# Patient Record
Sex: Male | Born: 1935 | Race: White | Hispanic: No | Marital: Married | Smoking: Never smoker
Health system: Southern US, Community
[De-identification: ages and names within clinical notes are randomized; demographics above are authoritative.]

---

## 2003-05-13 ENCOUNTER — Ambulatory Visit (HOSPITAL_COMMUNITY): Admission: RE | Admit: 2003-05-13 | Discharge: 2003-05-13 | Payer: Self-pay | Admitting: Gastroenterology

## 2003-06-16 ENCOUNTER — Ambulatory Visit (HOSPITAL_COMMUNITY): Admission: RE | Admit: 2003-06-16 | Discharge: 2003-06-16 | Payer: Self-pay | Admitting: Gastroenterology

## 2003-07-06 ENCOUNTER — Ambulatory Visit (HOSPITAL_COMMUNITY): Admission: RE | Admit: 2003-07-06 | Discharge: 2003-07-06 | Payer: Self-pay | Admitting: Gastroenterology

## 2003-07-17 ENCOUNTER — Ambulatory Visit (HOSPITAL_COMMUNITY): Admission: RE | Admit: 2003-07-17 | Discharge: 2003-07-17 | Payer: Self-pay | Admitting: Gastroenterology

## 2003-10-22 ENCOUNTER — Ambulatory Visit (HOSPITAL_COMMUNITY): Admission: RE | Admit: 2003-10-22 | Discharge: 2003-10-22 | Payer: Self-pay | Admitting: Gastroenterology

## 2004-01-04 ENCOUNTER — Ambulatory Visit (HOSPITAL_COMMUNITY): Admission: RE | Admit: 2004-01-04 | Discharge: 2004-01-04 | Payer: Self-pay | Admitting: Gastroenterology

## 2004-02-19 ENCOUNTER — Ambulatory Visit (HOSPITAL_COMMUNITY): Admission: RE | Admit: 2004-02-19 | Discharge: 2004-02-19 | Payer: Self-pay | Admitting: Oncology

## 2004-03-30 ENCOUNTER — Encounter (INDEPENDENT_AMBULATORY_CARE_PROVIDER_SITE_OTHER): Payer: Self-pay | Admitting: Specialist

## 2004-03-30 ENCOUNTER — Observation Stay (HOSPITAL_COMMUNITY): Admission: RE | Admit: 2004-03-30 | Discharge: 2004-03-31 | Payer: Self-pay | Admitting: Surgery

## 2005-02-02 ENCOUNTER — Ambulatory Visit (HOSPITAL_COMMUNITY): Admission: RE | Admit: 2005-02-02 | Discharge: 2005-02-02 | Payer: Self-pay | Admitting: Gastroenterology

## 2005-08-22 ENCOUNTER — Ambulatory Visit: Payer: Self-pay | Admitting: Gastroenterology

## 2005-08-24 ENCOUNTER — Ambulatory Visit: Payer: Self-pay | Admitting: Cardiology

## 2005-09-05 ENCOUNTER — Ambulatory Visit: Payer: Self-pay | Admitting: Gastroenterology

## 2007-05-01 ENCOUNTER — Inpatient Hospital Stay (HOSPITAL_COMMUNITY): Admission: EM | Admit: 2007-05-01 | Discharge: 2007-05-04 | Payer: Self-pay | Admitting: *Deleted

## 2007-05-01 ENCOUNTER — Ambulatory Visit: Payer: Self-pay | Admitting: Cardiology

## 2007-05-02 ENCOUNTER — Encounter (INDEPENDENT_AMBULATORY_CARE_PROVIDER_SITE_OTHER): Payer: Self-pay | Admitting: Internal Medicine

## 2007-05-02 ENCOUNTER — Ambulatory Visit: Payer: Self-pay | Admitting: Vascular Surgery

## 2007-05-23 ENCOUNTER — Ambulatory Visit: Payer: Self-pay

## 2007-05-23 ENCOUNTER — Encounter: Payer: Self-pay | Admitting: Cardiology

## 2007-07-22 ENCOUNTER — Ambulatory Visit: Payer: Self-pay | Admitting: Internal Medicine

## 2007-07-22 DIAGNOSIS — R1032 Left lower quadrant pain: Secondary | ICD-10-CM | POA: Insufficient documentation

## 2008-06-07 IMAGING — CT CT CHEST W/ CM
2 of 4 series · 15 of 36 positions shown, 18 images · IV contrast (omnipaque)
Comparison: Chest x-ray 05/01/07, CT thorax 02/02/05.

CLINICAL DATA: Syncope. Previos nodules on CT. 
CHEST CT WITH CONTRAST:
TECHNIQUE: Multidetector CT imaging of the chest was performed following the standard protocol during bolus administration of intravenous contrast.
Contrast:    180 cc Omnipaque 300.

[Series 2: chest routine 5.0 b40f · axial · 0.71mm/px · z∈[-306,-16]mm · 12 of 68 slices shown, 15 images]
[im 5/68  mediastinal]
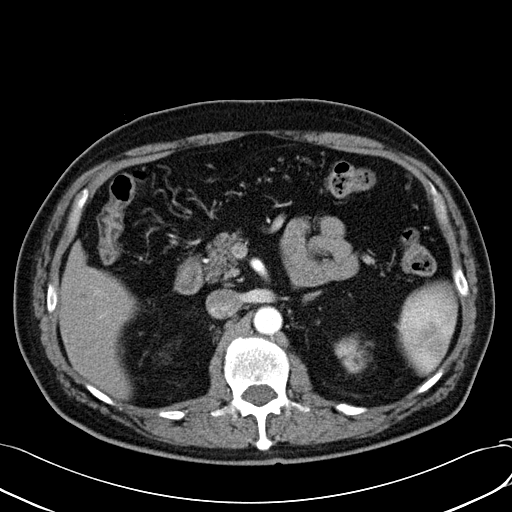
[im 5/68  lung]
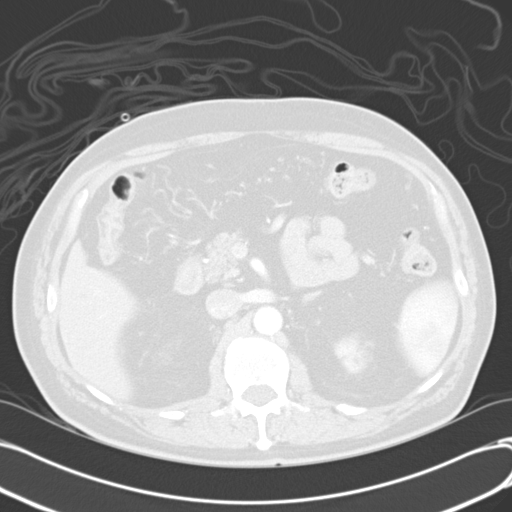
[im 10/68  lung]
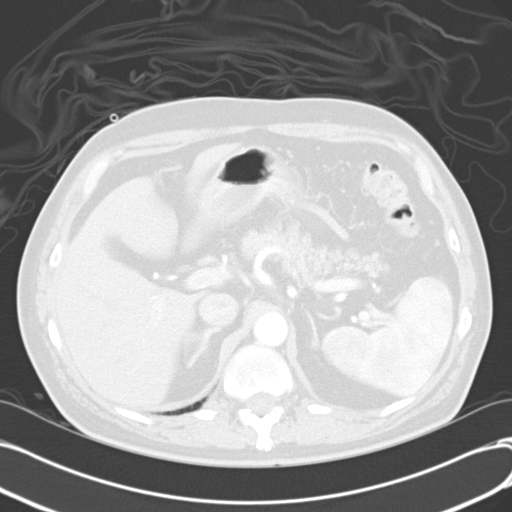
[im 15/68  lung]
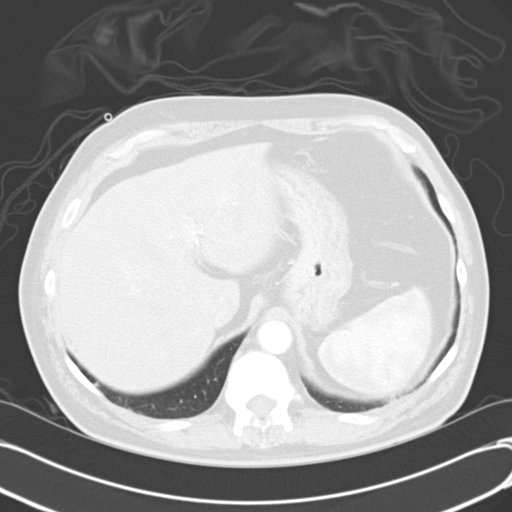
[im 20/68  lung]
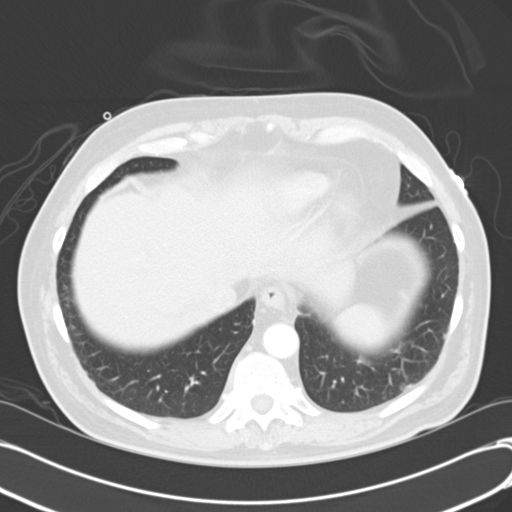
[im 24/68  mediastinal]
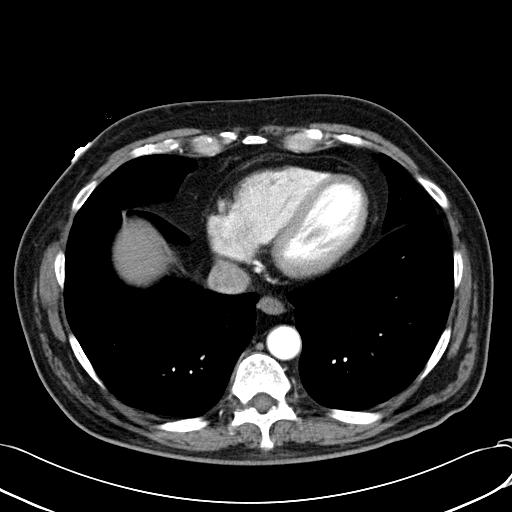
[im 24/68  lung]
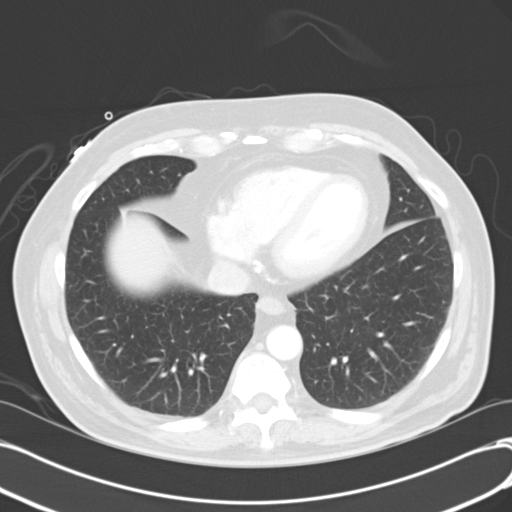
[im 29/68  lung]
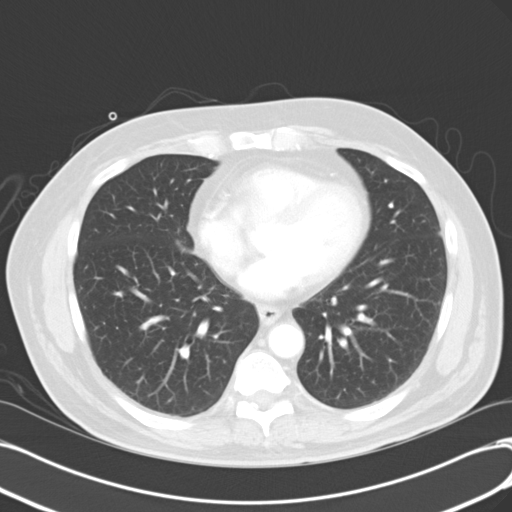
[im 39/68  lung]
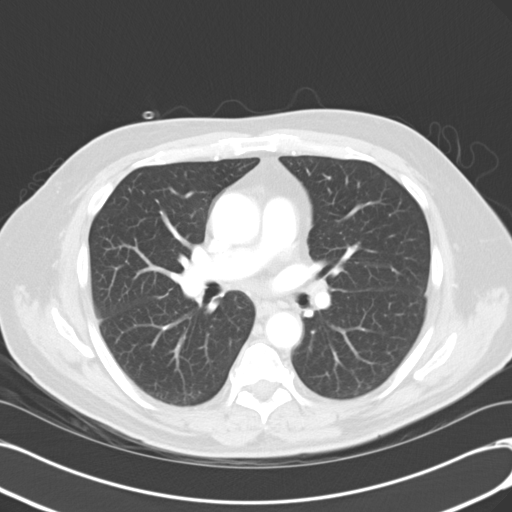
[im 44/68  lung]
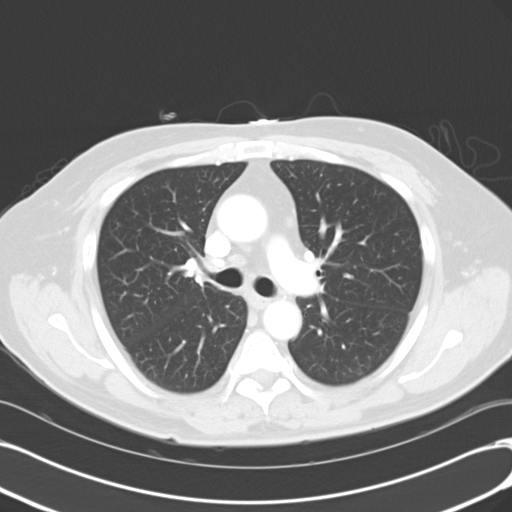
[im 48/68  mediastinal]
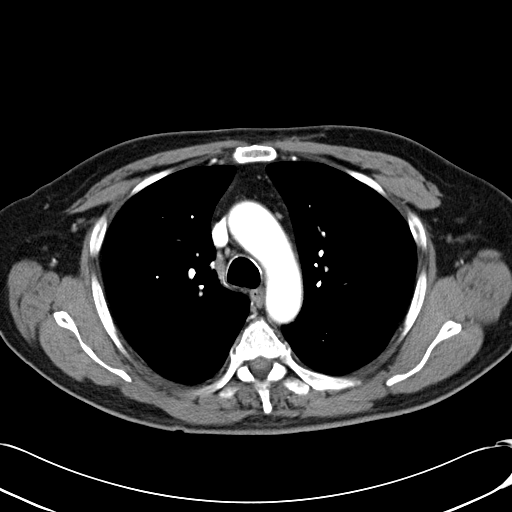
[im 48/68  lung]
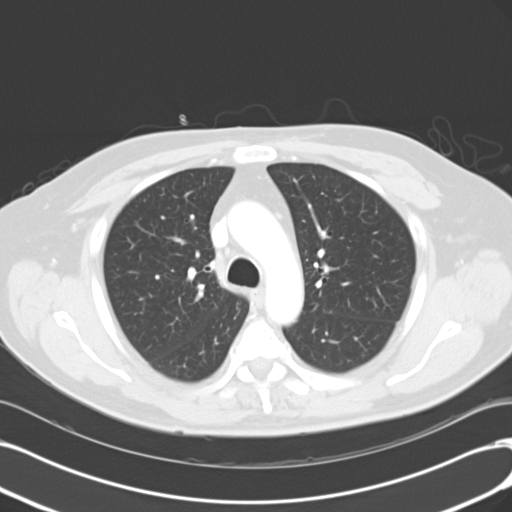
[im 53/68  lung]
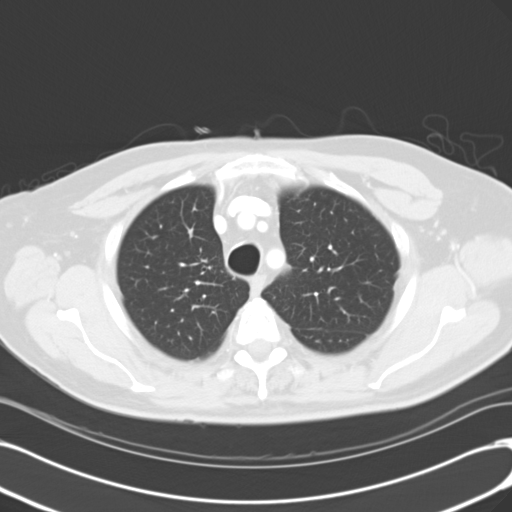
[im 58/68  lung]
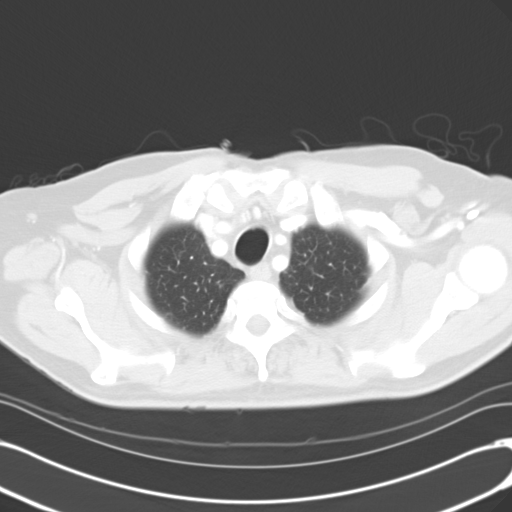
[im 63/68  lung]
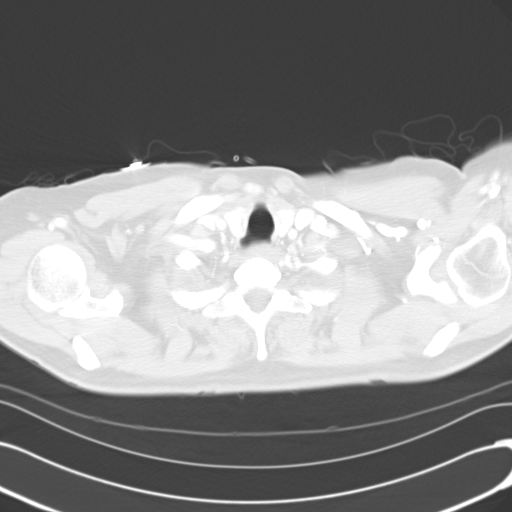

[Series 602: <mpr thick range> · coronal · 0.71mm/px · 3 of 80 slices shown]
[im 16/80  lung]
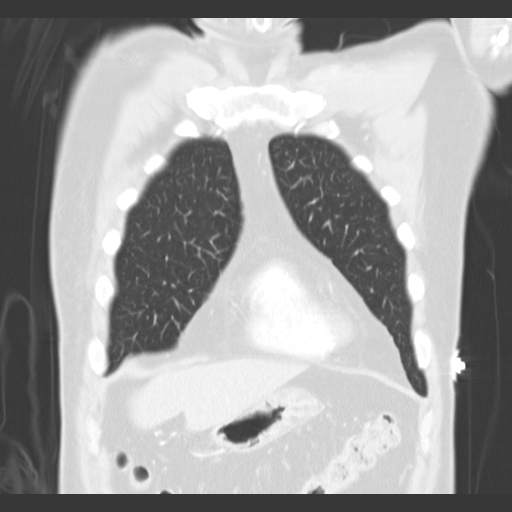
[im 32/80  lung]
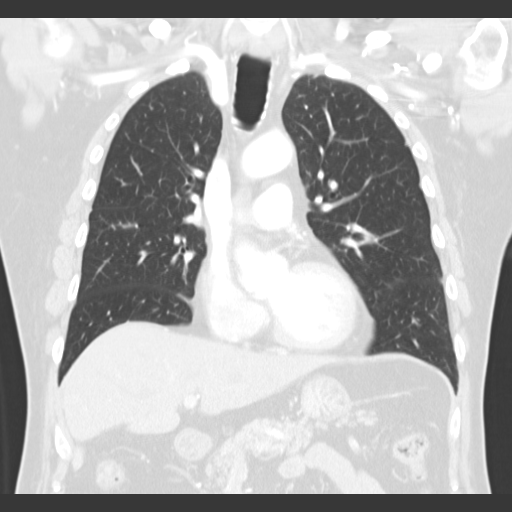
[im 48/80  lung]
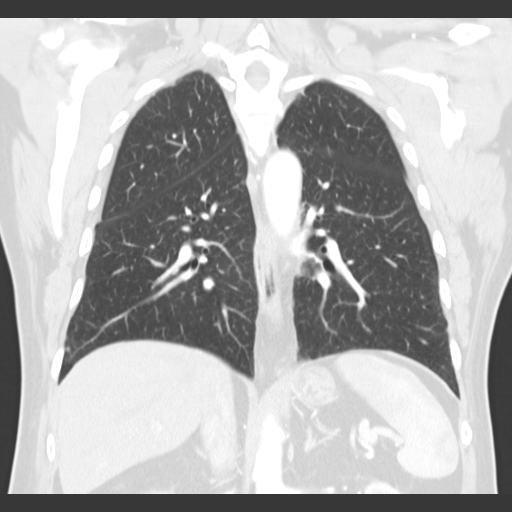

[15 of 36 positions shown; findings below may reference images not displayed]

FINDINGS: Review of the lung windows demonstrates no new or suspicious pulmonary nodules.  There is a 6 mm nodule in the right middle lobe (image 32) which is unchanged from 5552.  Vague nodular density in the right lower lobe on image 38 is smaller than prior. 
A subpleural nodule along the left oblique fissure is unchanged from prior (image 33).  
Review of the mediastinum demonstrates a mildly enlarged right hilar lymph node to 13 mm and a subcentimeter left hilar lymph node.  No pathologically enlarged mediastinal lymph nodes.  No axillary or supraclavicular lymph nodes.  
A limited view of the upper abdomen demonstrates prior cholecystectomy.  The adrenal glands are normal.  .
IMPRESSION: 1.  Stable, small pulmonary nodules.  
2.  Mild enlargement of the right hilar lymph node is nonspecific.

## 2010-08-30 NOTE — Consult Note (Signed)
[name], [name] NO.:  [id number]   MEDICAL RECORD NO.:  [id number]          PATIENT TYPE:  INP   LOCATION:  1411                         FACILITY:  [facility]   PHYSICIAN:  Melvyn Novas, M.D.  DATE OF BIRTH:  1935-05-28   DATE OF CONSULTATION:  05/02/2007  DATE OF DISCHARGE:                                 CONSULTATION   This is a neurology consultation called by Dr. Eda Paschal.  The patient is  an outpatient of Dr. Maisie Fus Day at [facility].   REASON FOR CONSULTATION:  Cardiogenic syncope.   CHIEF COMPLAINT:  The patient was admitted yesterday after a multiple-  minute spell of staring off and slumping to the right while sitting in a  chair.  Meanwhile, his daughter was demonstrating something to him on  the computer, noticed his absent-mindedness, tried to awaken him and  could not arouse him or get a response for several minutes.  The patient  had no observed tonic-clonic activity, no tongue bite, no incontinence,  and woke up without confusion, but, according to the daughter, had  stopped breathing or held his breath for awhile and started to breathe  very deeply when he came to.  The gentleman is a 75 year old male with a  history of benign prostate hyperplasia, thyroid hypofunction,  dehydration.  Yesterday he was hypotensive when EMS arrived.  He has  also a history of nephrolithiasis and diverticulitis.   REVIEW OF SYSTEMS:  I fainted for 4 or 5 minutes.  My daughter could  not get me to respond, and she yelled at me and shook me according to  the other family members.  I seemed to have stared into the air.  I  still do not remember what happened.   This 75 year old male was admitted yesterday at lunchtime with a  syncopal episode.  Has normal cardiac enzymes, a slightly elevated TSH.  Blood pressure when EMT arrived at the house was 80/50, but the patient  was able to walk to the bathroom, had a bowel movement, and when he  returned  blood pressure was measured again and was 106/80.  The patient  still received IV fluids from EMTs.  Normal chem-7 and CBC with diff  were ordered.   SOCIAL HISTORY:  The patient is married, has two adult daughters, is a  nonsmoker, nondrinker.   FAMILY HISTORY:  Positive for his father having had a myocardial  infarction and his mother a cerebral aneurysm; he is not sure at which  age.   The patient is on Flomax and hydrocodone p.r.n. for back pain.   PHYSICAL EXAMINATION:  VITAL SIGNS:  98 degrees Fahrenheit, pulse 70  beats per minute, respiratory rate is 20, blood pressure here is 130/72  today.  GENERAL:  The patient has been withheld Flomax and states that at home  he takes it very irregularly if needed.  He took it the morning before  he had the fainting spell, however.  HEENT:  Pupils react equal to light, have full extraocular movements.  He seems to be alert and oriented but is fairly  hard to understand and  seems to have a mild dysarthria, which the patient's family states is  his baseline.  He has full peripheral vision, normal facial movements.  Tongue and uvula midline.  NECK:  Supple.  NEUROLOGIC:  The patient has all four extremities with equal strength,  tone and mass.  Deep tendon reflexes 1+.  Downgoing toes to plantar  stimulation.  Sensory:  He felt all primary modalities equally  Coordination:  Normal finger-to-nose test without tremor, dysmetria or  ataxia.  Gait:  The patient is able to walk to the bathroom unassisted.   The patient just finished an MRI and an echocardiogram in the bedroom.  According to the echo, he might have mild left ventricular hypertrophy.  The MRI was not yet read or available.   My plan at this time is to use a diagnosis of syncope, 780.2.  I do not think a further workup is needed.  An EEG should be performed while he is still in the hospital.  He could be discharged afterwards with a cardiac monitoring device.  A neurologic  followup is not needed if the EEG turns out to be normal  and the MRI shows no abnormalities.      Melvyn Novas, M.D.  Electronically Signed     CD/MEDQ  D:  05/02/2007  T:  05/02/2007  Job:  [id number]   cc:   Hind Bosie Helper, MD

## 2010-08-30 NOTE — Procedures (Signed)
EEG NUMBER:  [id number].   HISTORY:  This is a 75 year old with syncopal event and confusion who is  having the EEG done to evaluate for seizure activity.   PROCEDURE:  This is a portable EEG.  Technical description:  Throughout  this portable EEG there is a posterior dominant rhythm of 9 to 10 Hz,  activity at 10 to 20 microvolts.  Background activity is symmetric,  mostly alpha activity at 10 to 30 microvolts.  Photic stimulation or  hyperventilation performed throughout this record.  The patient does  become drowsy, however, does not enter stage two sleep.  Throughout this  record, there is no definitive epileptiform activity noted throughout  the background.  EKG tracing reveals a heart rate of 60 to 70 beats a  minute.   IMPRESSION:  This routine EEG is within normal limits in the awake  state.  If seizures are of high concern we will consider a sleep-  deprived EEG for further evaluation.      Bevelyn Buckles. Nash Shearer, M.D.  Electronically Signed     ZOX:WRUE  D:  05/03/2007 13:43:28  T:  05/03/2007 14:45:35  Job #:  [id number]

## 2010-08-30 NOTE — Consult Note (Signed)
NAMEJAZPER, [name] NO.:  [id number]   MEDICAL RECORD NO.:  [id number]          PATIENT TYPE:  INP   LOCATION:  1411                         FACILITY:  [facility]   PHYSICIAN:  Madolyn Frieze. Jens Som, MD, FACCDATE OF BIRTH:  1936-01-22   DATE OF CONSULTATION:  05/03/2007  DATE OF DISCHARGE:                                 CONSULTATION   [name] is a 75 year old male with no prior cardiac history who we  were asked to evaluate for syncope.  Note, he typically does not have  dyspnea on exertion, orthopnea, PND, pedal edema, palpitations,  exertional chest pain.  The patient has been on Flomax for approximately  2 years.  He states that he takes this every-other day.  On days that he  takes it he becomes lightheaded after standing immediately after taking  the medication.  However, he has never had syncope.  On May 02, 2007, he was admitted by the [facility] following an episode of  syncope.  He states that his daughter was showing him something on the  computer.  He stood to walk to see her and became lightheaded.  He then  went back to his chair to sit down but his symptoms continued to worsen  and then he had frank syncope.  There was no associated chest pain,  nausea/vomiting or shortness of breath.  There was no incontinence or  seizure activity.  There was no weakness or loss of sensation of his  extremities.  Apparently, his blood pressure was low at the time of the  admission in the systolic range of 80 which slowly improved.  Since  admission, he has had cardiac markers that have been normal.  His  initial hemoglobin and hematocrit were 14 and 39.6.  A D-dimer was also  normal.  Note, a TSH has been elevated and being cared for by the  primary care service.  His initial electrocardiogram showed a sinus  rhythm at a rate of 64.  There were no ST changes and the intervals were  normal.  He also had an echocardiogram during this admission that showed  normal LV function and trivial aortic insufficiency.  The right  ventricle was mildly dilated.  He has had no problems since admission.  Note, he has been seen by neurology.  He has had an MRI that showed no  infarct.  They also performed an EEG with the results pending at the  time of this dictation.  Because of his syncopal episode, we were asked  to further evaluate.   PAST MEDICAL HISTORY:  There is no diabetes mellitus, hypertension or  hyperlipidemia.  He has had a history of diverticulitis as well as  benign prostatic hypertrophy.  He has had a prior cholecystectomy as  well as hernia repair.  He has had nephrolithiasis.   SOCIAL HISTORY:  He does not smoke nor does he consume alcohol.   FAMILY HISTORY:  Positive for coronary artery disease in his father who  had a myocardial infarction at age 5.   He has an allergy to COMPAZINE.  PRESENT MEDICATIONS:  Include enoxaparin, Protonix, and p.r.n.  medications.   REVIEW OF SYSTEMS:  He denies any headaches or fevers or chills.  There  is no productive cough or hemoptysis.  There is no dysphagia,  odynophagia, melena or hematochezia.  There is no dysuria or hematuria.  There is no rash or seizure activity.  There is no orthopnea, PND or  pedal edema.  The remaining systems are negative.   PHYSICAL EXAMINATION TODAY:  He is afebrile with temperature of 97.8.  His blood pressure is 136/80.  His pulse is 66.  He is well-developed  and well-nourished no acute stress.  His skin is warm and dry.  He does  not appear to be depressed and there is no peripheral clubbing.  His  back is normal.  HEENT:  Normal with normal eyelids.  NECK:  Supple with a normal upstroke bilaterally and there are no bruits  noted.  There is no jugular distention and no thyromegaly noted.  CHEST:  Clear to auscultation with normal expansion.  CARDIOVASCULAR:  Reveals a regular rate and rhythm with normal S1 and  S2.  There are no murmurs, rubs or gallops  noted.  ABDOMINAL EXAM:  Not tender or distended.  Positive bowel sounds.  No  hepatosplenomegaly.  No mass appreciated.  There is no abdominal bruit.  He has 2+ femoral pulses bilaterally and no bruits.  EXTREMITIES:  Show no edema and I can palpate no cords.  He has 2+  dorsalis pedis pulses bilaterally.  NEUROLOGIC:  Grossly intact.   His electrocardiogram is described above.  His renal function today show  BUN and creatinine of 15 and 0.95.  His hemoglobin and hematocrit are  12.3 and 33.9 today with a platelet count of 160.  His cardiac markers  are normal.   DIAGNOSIS:  1. Syncope - note the patient has been on Flomax for 2 years and he      has episodes of lightheadedness with standing every time he takes      the medication.  His syncopal episode occurred immediately after      taking Flomax and also sounded to be orthostatic in nature.  His      blood pressure was also documented to be low at that time.  Note,      his cardiac markers are normal, his ECG is normal, and his      echocardiogram shows normal LV function.  I do not think we need to      pursue further inpatient cardiac workup.  If possible, I would      favor discontinuing his Flomax in favor of another medication based      on urology suggestions.  If this cannot be done then I would      recommend adequate hydration and for the patient to stand slowly on      the days that he takes his Flomax.  We will plan to proceed with an      outpatient stress echocardiogram, although I think ischemia is      highly unlikely.  This is particular light of his family history of      coronary disease.  He will follow up with me after his stress      echocardiogram.  We could consider a monitor in the future if he      has recurrent episodes, but I strongly feel that this is most      likely related to orthostasis related  to Flomax.  2. Benign prostatic hypertrophy - management should be discussed with      urology.  We would  recommend discontinuing the Flomax if possible.      If it must be continued, then we would recommend adequate salt and      hydration intake and slowly standing after taking that medication.  3. History of nephrolithiasis.  4. History of diverticulitis.   The patient can be discharged from a cardiac standpoint and followup  will be arranged.      Madolyn Frieze Jens Som, MD, [facility]  Electronically Signed     BSC/MEDQ  D:  05/03/2007  T:  05/04/2007  Job:  [id number]

## 2010-08-30 NOTE — H&P (Signed)
[name], [name] NO.:  [id number]   MEDICAL RECORD NO.:  [id number]          PATIENT TYPE:  EMS   LOCATION:  ED                           FACILITY[facility]  [facility]   PHYSICIAN:  Herbie Saxon, MDDATE OF BIRTH:  Sep 30, 1935   DATE OF ADMISSION:  05/01/2007  DATE OF DISCHARGE:                              HISTORY & PHYSICAL   PRIMARY CARE PHYSICIAN:  Karene Fry Day, M.D. [facility].   No health care power of attorney designated.  He is a full code.   UROLOGIST:  Dr. Trey Paula   CHIEF COMPLAINT:  Passing out for five minutes today.   HISTORY OF PRESENT ILLNESS:  This is a 75 year old male who presents  after having a brief episode of passing out.  According to the daughter,  she and the patient were looking up something on the computer when he  suddenly started making gurgling sounds and then became unresponsive  staring at the ceiling, not responsive to speech or noxious stimuli.  His head was turned to the right.  There were no tonic clonic movements.  The patient also was noted to be having episodes of agonal breathing  followed by periods of apnea.  The whole episode lasted less than five  minutes.  When EMS was called, they noticed his blood pressure was low  at 80/50, however, he became alert and his blood pressure has been  improving since he was started on IV fluid.  The patient did not have  any urine incontinence, no biting of his tongue, no headache, no  wheezing was noted.  However, the daughter noticed that he was  diaphoretic during this whole episode.  The patient has never had this  sort of episode before.  He denies any preceding chest pain or shortness  of breath in the last few days.  No cough, no wheeze, no body swelling.  He does not have any diarrhea.  No dysuria.  No hematuria.  No nocturia.  The patient has not had any palpitations and never had any syncopal  episode before.  At present he is asymptomatic and  saturating 99% on  room air.  The patient did not have any associated fever or chills.   PAST MEDICAL HISTORY:  Kidney stones, diverticulitis, prostatic  hypertrophy.   PAST SURGICAL HISTORY:  Two herniorrhaphies, cholecystectomy.   FAMILY HISTORY:  Father died of a myocardial infarction.  Mother died of  a brain aneurysm and she also had pernicious anemia.   SOCIAL HISTORY:  No history of drug abuse, cigarette smoking, or alcohol  abuse.  He is married with children.  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   REVIEW OF SYSTEMS:  14 systems were reviewed and negative.   ALLERGIES:  COMPAZINE.   MEDICATIONS:  1. Flomax.  2. Hydrocodone one q.6 hours p.r.n.   PHYSICAL EXAMINATION:  GENERAL:  He is an elderly man in no acute  respiratory distress.  VITAL SIGNS:  Temperature 98, pulse 70, respiratory rate 20, blood  pressure 135/72.  HEENT:  Pupils equal, round, and reactive to light and accommodation.  Head is normocephalic and atraumatic.  Mucous membranes are moist.  NECK:  Supple.  There is no carotid bruit, no thyromegaly, and no  elevated jugular venous distention.  CHEST:  Clinically clear.  HEART:  Sounds 1 and 2 regular rate and rhythm.  ABDOMEN:  Soft and nontender.  No organomegaly.  Bowel sounds are  normoactive.  Inguinal orifices are patent.  NEUROLOGY:  He is alert and oriented to time, place, and person.  Speech  and memory are normal.  Power is 5 in all limbs.  Deep tendon reflexes  are 2+ globally.  Peripheral pulses present with no pedal edema.   LABORATORY DATA:  WBC 7.4, hematocrit 39.6, platelet count 173.  Urinalysis is negative.  Chemistry shows a sodium of 141, potassium 3.8,  chloride 108, bicarbonate 25, glucose 123, BUN 15, creatinine 0.9, AST  17, ALT 15, troponin I less than 0.05.  EKG shows normal sinus rhythm of  64.  Initial rhythm strip shows sinus  bradycardia at 59 per minute,  otherwise normal EKG.   CT of brain was normal.  The chest x-ray shows COPD changes.   ASSESSMENT:  1. Syncopal episode.  2. Altered mental status.  3. Transient seizure disorder.  4. Transient hypertension, rule out secondary to Flomax.  5. Chronic obstructive pulmonary disease, stable.   PLAN:  The patient is to be admitted to a telemetry bed for syncopal  workup.  We will obtain an EEG, two-dimensional echocardiogram, carotid  Doppler.  Obtain a CT of chest with contrast.  We will monitor his  serial cardiac enzymes and EKG's to rule out any underlying coronary  artery disease.  We will get his thyroid function tests and lipids and  will place him on Lovenox 40 mg subcu daily for DVT prophylaxis and  Protonix 40 mg IV daily for stress ulcer prophylaxis.  Obtain a  neurology and cardiology evaluation in the morning.  We will put him on  DuoNeb q.6 hours p.r.n.  The patient is for possible sleep apnea workup  with sleep study as an outpatient.  He will stay on IV fluid normal  saline at 40 mL an hour.  For now we will put him on a regular diet as  tolerated, oxygen 2-4 liters by nasal cannula p.r.n. to keep his  saturations greater than 90%.  We will get his orthostatic blood  pressure checked q.shift and is to be on seizure, fall, and aspiration  precautions.  His illness, medication, tests, and treatment plan  discussed with him and his family.  They verbalized understanding.      Herbie Saxon, MD  Electronically Signed     MIO/MEDQ  D:  05/01/2007  T:  05/01/2007  Job:  [id number]

## 2010-08-30 NOTE — Discharge Summary (Signed)
[name], [name]                ACCOUNT NO.:  [id number]   MEDICAL RECORD NO.:  [id number]          PATIENT TYPE:  INP   LOCATION:  1411                         FACILITY:  [facility]   PHYSICIAN:  Hind I Elsaid, MD      DATE OF BIRTH:  [birth year]   DATE OF ADMISSION:  05/01/2007  DATE OF DISCHARGE:  05/04/2007                               DISCHARGE SUMMARY   DISCHARGE DIAGNOSES:  1. Syncope felt to be secondary to Flomax induced orthostatic      hypotension.  2. Hypotension, resolved.  3. Subclinical hypothyroidism.  4. Benign prostatic hypertrophy.  5. History of diverticulosis.  6. Kidney stone.   MEDICATIONS:  1. Hydrocodone one tab p.o. q.6 h p.r.n.  2. Flomax was discontinued.   HOSPITAL CONSULTATIONS:  1. Neurology consulted for evaluation of possible seizures.  2. Cardiology consulted.   HISTORY OF PRESENT ILLNESS:  This is a 75 year old male who presented  after having this as result of passing out.  Mainly the syncope happened  after the patient taking Flomax.   PROCEDURES:  1. MRI of the brain.  No acute infarct.  Moderate nonspecific white      matter change may be related to small vessel disease.  2. 2-D echo showed ejection fraction of 55 with no left ventricular      motion abnormality.  .  3. Carotid duplex was negative for any carotid stenosis.  4. EEG result is pending.   HOSPITAL COURSE:  The patient was admitted to the hospital for  evaluation of the cause of syncope, admitted to tele with cardiac  monitor which was uneventful for any stated arrhythmia.  MRI of the  brain was negative for any acute infarct or source as the cause or  possibility of seizure.  Also EEG done but there result is pending.  The  result is to be followed with primary care.  Also during hospitalization  the patient had cardiac enzymes recycled which were absolutely negative  and 2-D echo which does not show any evidence of ventricular wall motion  abnormalities.  We felt the  syncope was secondary to Flomax.  Neuro and cardiology did  follow the patient and cardiology recommended outpatient stress echo to  be done as outpatient.  Accordingly, Flomax was discontinued.  The  patient will receive an appointment to follow with cardiology at [facility]  as outpatient.      Hind Bosie Helper, MD  Electronically Signed     HIE/MEDQ  D:  05/04/2007  T:  05/04/2007  Job:  [id number]

## 2010-08-30 NOTE — Assessment & Plan Note (Signed)
[facility]                         GASTROENTEROLOGY OFFICE NOTE   [name], [name]                       MRN:          [id number]  DATE:07/22/2007                            DOB:          [birth year]    CHIEF COMPLAINT:  Left-sided abdominal pain.   HISTORY:  The patient is a man previously seen by Dr. Madilyn Fireman and Dr. Victorino Dike.  He has had intermittent left-sided abdominal pain since at  least 2005.  There is an extensive workup with CT scans.  He has thought  that he might have diverticulosis or diverticulitis, but neither CT nor  colonoscopy have shown that.  His last CT in May of 2007 was normal.  He  continued to have these intermittent spells.  He feels gassy.  He will  belch and feel better and he will have flatus.  He has bilateral  inguinal herniorrhaphies.  He has had a cholecystectomy.  He has a  diagnosis of reflux disease.  He has had nephrolithiasis.  He has not  really lost weight or had any other problems.  He had a borderline sed  rate elevated, but normal CBC and CMET in 2007.  Recently, he had some  problems again.  He had a distended abdomen and felt very bloated and  then did not get relief until he vomited.  That has not really ever  happened before.  He generally moves his bowels regularly.  There is no  fever or bleeding.  His wife it starts after strenuous activity at  times.  He did receive antibiotics recently for presumed diverticulitis.   SOCIAL HISTORY:  He is not a smoker.  He is here with his wife.   MEDICATIONS:  Prilosec OTC as needed.   ALLERGIES:  COMPAZINE, FLOMAX.   PHYSICAL EXAMINATION:  GENERAL:  Well-developed, elderly white man.  There is a speech impediment.  VITAL SIGNS:  Weight 160 pounds, blood pressure 110/68, pulse 92.  HEENT:  Eyes anicteric.  ABDOMEN:  Soft and nontender.  No splash.  There are herniorrhaphy scars  and laparoscopic cholecystectomy scars.  I detect no hernia.  The groin  is  free of lymphadenopathy.  NEUROLOGY:  Cranial nerves II-XII grossly intact.   ASSESSMENT:  Recurrent left-sided abdominal pain mainly in the left  lower quadrant.  He has gas and bloating.  He has had an episode of  nausea and vomiting and abdominal distention.   I wonder if he has not had some sort of recurrent partial obstruction  from adhesions.  Whatever this is it certainly seems to be benign.  He  was reassured today.   PLAN:  He will try to take Align.  Perhaps this probiotic will help with  the gassiness and bloating feeling.  I talked about working this up  further with something like a CT enterography, but I do not think  anybody would operate for a problem even if they found something given  the overall symptoms since repeat operations may lead to adhesions.   He will follow up as needed.   NOTE:  This man does not  have diverticulosis or diverticulitis as best I  can tell.     Iva Boop, MD,FACG  Electronically Signed    CEG/MedQ  DD: 07/22/2007  DT: 07/22/2007  Job #: [id number]   cc:   Alfredia Client, MD

## 2010-09-02 NOTE — Op Note (Signed)
NAME:  [name], [name]                     ACCOUNT NO.:  [id number]   MEDICAL RECORD NO.:  [id number]                   PATIENT TYPE:  AMB   LOCATION:  ENDO                                 FACILITY:  [facility]   PHYSICIAN:  John C. Madilyn Fireman, M.D.                 DATE OF BIRTH:  Jul 16, 1935   DATE OF PROCEDURE:  05/13/2003  DATE OF DISCHARGE:                                 OPERATIVE REPORT   PROCEDURE:  Colonoscopy.   INDICATIONS FOR PROCEDURE:  Left lower quadrant abdominal pain in a patient  with no prior colon screening.   DESCRIPTION OF PROCEDURE:  The patient was placed in the left lateral  decubitus position and placed on the pulse monitor with continuous low-flow  oxygen delivered by nasal cannula.  He was sedated with 75 mcg IV fentanyl  and 8 mg IV Versed.  The Olympus video colonoscope was inserted into the  rectum and advanced to the cecum, confirmed by transillumination of  McBurney's point and visualization of the ileocecal valve and appendiceal  orifice.  Prep was excellent.  The cecum, ascending, transverse, descending,  and sigmoid colon all appeared normal with no masses, polyps, diverticula,  or other mucosal abnormalities.  The rectum likewise appeared normal and  retroflexed view of the anus revealed no obvious internal hemorrhoids.  The  scope was then withdrawn and the patient returned to the recovery room in  stable condition.  He tolerated the procedure well and there were no  immediate complications.   IMPRESSION:  Normal colonoscopy.   PLAN:  Will try a course of Levbid and follow up his abdominal pain in the  office in a few weeks.                                               John C. Madilyn Fireman, M.D.    JCH/MEDQ  D:  05/13/2003  T:  05/13/2003  Job:  [id number]   cc:   Ernestina Penna, M.D.  [address] [place]  [place] [address]  Fax: [phone]

## 2010-09-02 NOTE — Op Note (Signed)
[name], [name] NO.:  [id number]   MEDICAL RECORD NO.:  [id number]          PATIENT TYPE:  OBV   LOCATION:  0098                         FACILITY:  [facility]   PHYSICIAN:  Currie Paris, M.D.DATE OF BIRTH:  Mar 25, 1936   DATE OF PROCEDURE:  03/30/2004  DATE OF DISCHARGE:                                 OPERATIVE REPORT   CCS NUMBER:  [id number].   PREOPERATIVE DIAGNOSIS:  Chronic calculus cholecystitis.   POSTOPERATIVE DIAGNOSIS:  Chronic calculus cholecystitis.   OPERATION:  Laparoscopic cholecystectomy with operative cholangiogram.   SURGEON:  Currie Paris, M.D.   ASSISTANT:  Sharlet Salina T. Hoxworth, M.D.   ANESTHESIA:  General endotracheal.   CLINICAL HISTORY:  This patient is a 75 year old gentleman with known  gallstones admitted and evaluated by Dr. Dorena Cookey.  He has been having  intermittent right upper quadrant subcostal pain, sometimes post prandially.  A lot of his pain has been left mid abdomen, not really related to anything  he eats, and workup for that is completely negative.  It has been unclear  whether his gallstones contributed somewhat to his left-sided pain or just  to the right subcostal pain that he had been having.  He has also been  treated for kidney stones.  After discussion with the patient, he wanted to  proceed to a cholecystectomy to see if he would at least get some relief of  his right upper quadrant pain and possibly some of the left.   DESCRIPTION OF PROCEDURE:  The patient was seen in the holding area, and he  had no further questions.  He was taken to the operating room.  After  satisfactory general endotracheal anesthesia had been obtained, the abdomen  was clipped, prepped and draped.  Then 0.25% plain Marcaine was used for  each incision.  The umbilical incision was made, the fascia opened, and the  peritoneal cavity entered under direct vision.  A purse-string was placed,  the Hasson introduced, and the  abdomen insufflated to 15.  Additional  cannulae were placed under direct vision in the epigastrium and in the right  upper quadrant.   Once we had insufflated the abdomen, the camera was used to examine the  abdomen.  The gallbladder was a little distended and whitish, suggestive of  some chronic cholecystitis.  There were no gross abnormalities noted in the  abdomen with the left side of the abdomen showing what appeared to be normal  omentum.  We could see parts of the colon readily that did not appear  inflamed.  There were no adhesions to the anterior abdominal wall or down  into the pelvis noted.   Attention was then returned to the gallbladder.  The patient was placed in  reverse Trendelenburg.  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  I opened it up and  found a very small sclerotic cystic duct as well as what appeared to be a  posterior branch and anterior branch of  the cystic artery.  I initially  placed one clip on the artery and one on the duct and opened the duct.   A percutaneous Cook catheter was placed and operative cholangiography done.  This showed good filling of the duodenum, a fairly small common duct with no  filling defects and good filling of the hepatic radicals.   The cystic duct catheter was removed, and three clips placed on the cystic  duct, and it was divided.  Two additional clips were placed in the cystic  artery posterior branch, which was divided, and then an anterior branch was  clipped and divided.  The gallbladder was then removed from below to above  with coagulation per cautery.  Once this was disconnected, it was placed in  a bag.  We irrigated and made sure everything along the bed of the  gallbladder appeared to be dry, and the gallbladder was then brought out the  umbilical port.  The abdomen was  reinsufflated for a final check for  hemostasis, and once everything appeared to be completely dry and the  irrigant had been suctioned out, we removed the lateral ports under direct  vision.  There was no bleeding.  The umbilical port was removed, and the  purse-string tied down to close this.  The abdomen was deflated through the  epigastric port.  Skin was closed with 4-0 Monocryl, subcuticular with  Dermabond.   Patient tolerated the procedure well.  There were no operative  complications.  All counts were correct.     Chri   CJS/MEDQ  D:  03/30/2004  T:  03/30/2004  Job:  [id number]   cc:   Everardo All. Madilyn Fireman, M.D.  [address]  [place]  [place] [address]  Fax: [phone]

## 2011-01-04 LAB — COMPREHENSIVE METABOLIC PANEL
ALT: 15
AST: 17
Albumin: 3.7
Alkaline Phosphatase: 96
BUN: 15
CO2: 25
Calcium: 9.1
Chloride: 108
Creatinine, Ser: 0.95
GFR calc Af Amer: >60
GFR calc non Af Amer: >60
Glucose, Bld: 123 — ABNORMAL HIGH
Potassium: 3.8
Sodium: 141
Total Bilirubin: 0.8
Total Protein: 6.3

## 2011-01-04 LAB — TSH: TSH: 6.627 — ABNORMAL HIGH

## 2011-01-04 LAB — DIFFERENTIAL
Basophils Absolute: 0
Basophils Relative: 0
Eosinophils Absolute: 0
Eosinophils Relative: 0
Lymphocytes Relative: 12
Lymphs Abs: 0.9
Monocytes Absolute: 0.3
Monocytes Relative: 4
Neutro Abs: 6.2
Neutrophils Relative %: 83 — ABNORMAL HIGH

## 2011-01-04 LAB — POCT CARDIAC MARKERS
CKMB, poc: 1.4
Myoglobin, poc: 90.8
Operator id: 3872
Troponin i, poc: <0.05

## 2011-01-04 LAB — URINALYSIS, ROUTINE W REFLEX MICROSCOPIC
Bilirubin Urine: NEGATIVE
Glucose, UA: NEGATIVE
Hgb urine dipstick: NEGATIVE
Ketones, ur: NEGATIVE
Nitrite: NEGATIVE
Protein, ur: NEGATIVE
Specific Gravity, Urine: 1.019
Urobilinogen, UA: 0.2
pH: 6

## 2011-01-04 LAB — TROPONIN I: Troponin I: 0.04

## 2011-01-04 LAB — CBC
HCT: 39.6
Hemoglobin: 14
MCHC: 35.4
MCV: 89.6
Platelets: 173
RBC: 4.42
RDW: 13.1
WBC: 7.4

## 2011-01-04 LAB — CALCIUM: Calcium: 8.7

## 2011-01-04 LAB — CK TOTAL AND CKMB (NOT AT ARMC): CK, MB: 1

## 2011-01-04 LAB — D-DIMER, QUANTITATIVE: D-Dimer, Quant: 0.27

## 2011-01-04 LAB — PHOSPHORUS: Phosphorus: 2.8

## 2011-01-05 LAB — T4, FREE: Free T4: 1.21

## 2011-01-05 LAB — PSA: PSA: 0.65

## 2011-01-05 LAB — CBC
MCHC: 36.3 — ABNORMAL HIGH
Platelets: 160
RDW: 13.3

## 2011-01-05 LAB — CARDIAC PANEL(CRET KIN+CKTOT+MB+TROPI)
CK, MB: 1
Relative Index: INVALID
Total CK: 54
Troponin I: 0.03

## 2011-01-05 LAB — COMPREHENSIVE METABOLIC PANEL
ALT: 15
AST: 16
Calcium: 9
GFR calc Af Amer: >60
Sodium: 139
Total Protein: 5.3 — ABNORMAL LOW

## 2011-01-05 LAB — LIPID PANEL
LDL Cholesterol: 152 — ABNORMAL HIGH
Triglycerides: 130
VLDL: 26

## 2011-01-05 LAB — OCCULT BLOOD X 1 CARD TO LAB, STOOL: Fecal Occult Bld: NEGATIVE

## 2012-07-08 ENCOUNTER — Other Ambulatory Visit: Payer: Self-pay

## 2012-07-08 MED ORDER — PRAVASTATIN SODIUM 40 MG PO TABS: 40.0000 mg | ORAL_TABLET | Freq: Every day | ORAL | Status: AC

## 2012-08-06 ENCOUNTER — Telehealth: Payer: Self-pay | Admitting: Nurse Practitioner

## 2012-08-06 NOTE — Telephone Encounter (Signed)
Spoke with wife and scheduled patient an appt for tomorrow evening.

## 2012-08-07 ENCOUNTER — Ambulatory Visit (INDEPENDENT_AMBULATORY_CARE_PROVIDER_SITE_OTHER): Payer: Medicare Other | Admitting: Family Medicine

## 2012-08-07 ENCOUNTER — Encounter: Payer: Self-pay | Admitting: Family Medicine

## 2012-08-07 VITALS — BP 105/65 | HR 75 | Temp 97.6°F | Ht 64.0 in | Wt 149.0 lb

## 2012-08-07 DIAGNOSIS — J329 Chronic sinusitis, unspecified: Secondary | ICD-10-CM

## 2012-08-07 DIAGNOSIS — J029 Acute pharyngitis, unspecified: Secondary | ICD-10-CM

## 2012-08-07 LAB — POCT RAPID STREP A (OFFICE): Rapid Strep A Screen: NEGATIVE

## 2012-08-07 MED ORDER — AZITHROMYCIN 250 MG PO TABS
ORAL_TABLET | ORAL | Status: AC
Start: 2012-08-07 — End: ?

## 2012-08-07 NOTE — Patient Instructions (Signed)
Drink plenty of fluids. Take Tylenol as needed for aches pains and fever Take Mucinex as needed for cough

## 2012-08-07 NOTE — Progress Notes (Signed)
  Subjective:    Patient ID: [name], male    DOB: 1935-09-03, 77 y.o.   MRN: [id number]  HPI Headache and sore throat for the past 2-3 days worse yesterday with fever.   Review of Systems  Constitutional: Positive for fever.  HENT: Positive for sore throat (started yesterday) and postnasal drip (slight). Negative for congestion.   Respiratory: Negative for cough.   Neurological: Positive for headaches.       Objective:   Physical Exam  Nursing note and vitals reviewed. Constitutional: He appears well-developed and well-nourished. He appears distressed.  HENT:  Head: Normocephalic.  Right Ear: External ear normal.  Left Ear: External ear normal.  Nasal congestion bilateral  Eyes: Conjunctivae are normal. Right eye exhibits no discharge. Left eye exhibits no discharge.  Neck: Normal range of motion. Neck supple. No thyromegaly present.  Cardiovascular: Normal rate and regular rhythm.   Pulmonary/Chest: Effort normal and breath sounds normal. No respiratory distress. He has no wheezes. He has no rales.  Dry cough  Lymphadenopathy:    He has no cervical adenopathy.    Results for orders placed in visit on 08/07/12  POCT RAPID STREP A (OFFICE)      Result Value Range   Rapid Strep A Screen Negative  Negative         Assessment & Plan:  1. Sore throat - POCT rapid strep A - Culture, Group A Strep - azithromycin (ZITHROMAX) 250 MG tablet; Take as directed  Dispense: 6 tablet; Refill: 0  2. Sinusitis - azithromycin (ZITHROMAX) 250 MG tablet; Take as directed  Dispense: 6 tablet; Refill: 0  Patient Instructions  Drink plenty of fluids. Take Tylenol as needed for aches pains and fever Take Mucinex as needed for cough

## 2012-08-09 LAB — CULTURE, GROUP A STREP: Organism ID, Bacteria: NORMAL

## 2012-08-15 DEATH — deceased

## 2012-11-25 ENCOUNTER — Telehealth: Payer: Self-pay | Admitting: Family Medicine
